# Patient Record
Sex: Male | Born: 2000 | Race: White | Hispanic: No | Marital: Single | Smoking: Never smoker
Health system: Southern US, Community
[De-identification: ages and names within clinical notes are randomized; demographics above are authoritative.]

## PROBLEM LIST (undated history)

## (undated) DIAGNOSIS — T7840XA Allergy, unspecified, initial encounter: Secondary | ICD-10-CM

## (undated) DIAGNOSIS — F329 Major depressive disorder, single episode, unspecified: Secondary | ICD-10-CM

## (undated) DIAGNOSIS — F32A Depression, unspecified: Secondary | ICD-10-CM

## (undated) DIAGNOSIS — F419 Anxiety disorder, unspecified: Secondary | ICD-10-CM

## (undated) HISTORY — DX: Anxiety disorder, unspecified: F41.9

## (undated) HISTORY — DX: Allergy, unspecified, initial encounter: T78.40XA

## (undated) HISTORY — DX: Depression, unspecified: F32.A

## (undated) HISTORY — PX: TONSILLECTOMY: SUR1361

---
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
## 2020-01-04 ENCOUNTER — Other Ambulatory Visit: Payer: Self-pay | Admitting: Family Medicine

## 2020-01-04 DIAGNOSIS — F39 Unspecified mood [affective] disorder: Secondary | ICD-10-CM

## 2020-03-29 ENCOUNTER — Other Ambulatory Visit: Payer: Self-pay | Admitting: Family Medicine

## 2020-03-29 DIAGNOSIS — F39 Unspecified mood [affective] disorder: Secondary | ICD-10-CM

## 2020-04-15 ENCOUNTER — Other Ambulatory Visit: Payer: Self-pay | Admitting: Family Medicine

## 2020-04-15 DIAGNOSIS — F39 Unspecified mood [affective] disorder: Secondary | ICD-10-CM

## 2020-09-28 ENCOUNTER — Other Ambulatory Visit: Payer: Self-pay | Admitting: Family Medicine

## 2020-09-28 DIAGNOSIS — F39 Unspecified mood [affective] disorder: Secondary | ICD-10-CM

## 2020-09-28 NOTE — Telephone Encounter (Signed)
Pt overdue office visit and refill. Pt >3 months overdue. Last RF 03/29/20 #90  [name] FNP is still showing as PCP and she is no longer with practice.   MyChart message sent to pt to call office for appointment.

## 2020-09-29 NOTE — Telephone Encounter (Signed)
lvm for pt to call the office and schedule an appt with Danelle Berry or Gabriel Cirri

## 2020-10-06 ENCOUNTER — Ambulatory Visit: Payer: BC Managed Care – PPO | Admitting: Family Medicine

## 2020-10-09 ENCOUNTER — Ambulatory Visit: Payer: BC Managed Care – PPO | Admitting: Unknown Physician Specialty

## 2020-10-09 ENCOUNTER — Encounter: Payer: Self-pay | Admitting: Unknown Physician Specialty

## 2020-10-09 ENCOUNTER — Other Ambulatory Visit: Payer: Self-pay

## 2020-10-09 VITALS — BP 120/76 | HR 82 | Temp 98.0°F | Resp 18 | Ht 74.0 in | Wt 159.7 lb

## 2020-10-09 DIAGNOSIS — F39 Unspecified mood [affective] disorder: Secondary | ICD-10-CM | POA: Diagnosis not present

## 2020-10-09 DIAGNOSIS — E785 Hyperlipidemia, unspecified: Secondary | ICD-10-CM | POA: Diagnosis not present

## 2020-10-09 DIAGNOSIS — F321 Major depressive disorder, single episode, moderate: Secondary | ICD-10-CM

## 2020-10-09 DIAGNOSIS — G4700 Insomnia, unspecified: Secondary | ICD-10-CM | POA: Diagnosis not present

## 2020-10-09 LAB — LIPID PANEL
Cholesterol: 168 mg/dL (ref <170)
HDL: 48 mg/dL (ref >45)
LDL Cholesterol (Calc): 102 mg/dL (calc) (ref <110)
Non-HDL Cholesterol (Calc): 120 mg/dL (calc) — ABNORMAL HIGH (ref <120)
Total CHOL/HDL Ratio: 3.5 (calc) (ref <5.0)
Triglycerides: 85 mg/dL (ref <90)

## 2020-10-09 LAB — COMPREHENSIVE METABOLIC PANEL
AG Ratio: 2.6 (calc) — ABNORMAL HIGH (ref 1.0–2.5)
ALT: 6 U/L — ABNORMAL LOW (ref 8–46)
AST: 12 U/L (ref 12–32)
Albumin: 4.5 g/dL (ref 3.6–5.1)
Alkaline phosphatase (APISO): 111 U/L (ref 46–169)
BUN: 13 mg/dL (ref 7–20)
CO2: 27 mmol/L (ref 20–32)
Calcium: 9.6 mg/dL (ref 8.9–10.4)
Chloride: 105 mmol/L (ref 98–110)
Creat: 0.81 mg/dL (ref 0.60–1.26)
Globulin: 1.7 g/dL (calc) — ABNORMAL LOW (ref 2.1–3.5)
Glucose, Bld: 107 mg/dL — ABNORMAL HIGH (ref 65–99)
Potassium: 4.3 mmol/L (ref 3.8–5.1)
Sodium: 139 mmol/L (ref 135–146)
Total Bilirubin: 0.6 mg/dL (ref 0.2–1.1)
Total Protein: 6.2 g/dL — ABNORMAL LOW (ref 6.3–8.2)

## 2020-10-09 LAB — CBC WITH DIFFERENTIAL/PLATELET
Absolute Monocytes: 432 cells/uL (ref 200–950)
Basophils Absolute: 30 cells/uL (ref 0–200)
Basophils Relative: 0.5 %
Eosinophils Absolute: 150 cells/uL (ref 15–500)
Eosinophils Relative: 2.5 %
HCT: 40.8 % (ref 38.5–50.0)
Hemoglobin: 13.5 g/dL (ref 13.2–17.1)
Lymphs Abs: 1602 cells/uL (ref 850–3900)
MCH: 29.7 pg (ref 27.0–33.0)
MCHC: 33.1 g/dL (ref 32.0–36.0)
MCV: 89.7 fL (ref 80.0–100.0)
MPV: 13.1 fL — ABNORMAL HIGH (ref 7.5–12.5)
Monocytes Relative: 7.2 %
Neutro Abs: 3786 cells/uL (ref 1500–7800)
Neutrophils Relative %: 63.1 %
Platelets: 167 10*3/uL (ref 140–400)
RBC: 4.55 10*6/uL (ref 4.20–5.80)
RDW: 12.5 % (ref 11.0–15.0)
Total Lymphocyte: 26.7 %
WBC: 6 10*3/uL (ref 3.8–10.8)

## 2020-10-09 MED ORDER — SERTRALINE HCL 100 MG PO TABS
1.0000 | ORAL_TABLET | Freq: Every day | ORAL | 0 refills | Status: DC
Start: 2020-10-09 — End: 2021-01-09

## 2020-10-09 NOTE — Progress Notes (Signed)
BP 120/76   Pulse 82   Temp 98 F (36.7 C)   Resp 18   Ht 6\' 2"  (1.88 m)   Wt 159 lb 11.2 oz (72.4 kg)   SpO2 98%   BMI 20.50 kg/m    Subjective:    Patient ID: Gabriel Pierce, male    DOB: 22-Mar-2001, 20 y.o.   MRN: [id number]  HPI: Gabriel Pierce is a 20 y.o. male  Chief Complaint  Patient presents with  . Follow-up  . Depression  . Medication Refill   Pt feels depression is not so much a problem, mostly irritable and trouble sleeping.  Has not yet worked with counseling.  However, does sleep from 2-10A daily but reports unable to fall asleep without lying there for 2 hours no matter what time he goes to bed.   Depression screen [facility] 2/9 10/09/2020 10/03/2019 08/09/2019 05/17/2019 04/23/2019  Decreased Interest 2 2 3 1 3   Down, Depressed, Hopeless 0 2 2 1 1   PHQ - 2 Score 2 4 5 2 4   Altered sleeping 3 3 2  0 3  Tired, decreased energy 2 2 3 1 3   Change in appetite 2 3 3  0 1  Feeling bad or failure about yourself  1 1 1 1 1   Trouble concentrating 1 1 0 1 2  Moving slowly or fidgety/restless 1 0 0 1 1  Suicidal thoughts 0 0 0 0 0  PHQ-9 Score 12 14 14 6 15   Difficult doing work/chores Somewhat difficult Somewhat difficult Somewhat difficult Somewhat difficult Somewhat difficult  Some recent data might be hidden     Relevant past medical, surgical, family and social history reviewed and updated as indicated. Interim medical history since our last visit reviewed. Allergies and medications reviewed and updated.  Review of Systems  All other systems reviewed and are negative.   Per HPI unless specifically indicated above     Objective:    BP 120/76   Pulse 82   Temp 98 F (36.7 C)   Resp 18   Ht 6\' 2"  (1.88 m)   Wt 159 lb 11.2 oz (72.4 kg)   SpO2 98%   BMI 20.50 kg/m   Wt Readings from Last 3 Encounters:  10/09/20 159 lb 11.2 oz (72.4 kg) (56 %, Z= 0.16)*  10/03/19 157 lb 12.8 oz (71.6 kg) (59 %, Z= 0.22)*  05/17/19 171 lb 1.6 oz (77.6 kg) (77 %, Z= 0.74)*   *  Growth percentiles are based on CDC (Boys, 2-20 Years) data.    Physical Exam Constitutional:      General: He is not in acute distress.    Appearance: Normal appearance. He is well-developed.  HENT:     Head: Normocephalic and atraumatic.  Eyes:     General: Lids are normal. No scleral icterus.       Right eye: No discharge.        Left eye: No discharge.     Conjunctiva/sclera: Conjunctivae normal.  Cardiovascular:     Rate and Rhythm: Normal rate.  Pulmonary:     Effort: Pulmonary effort is normal.  Abdominal:     Palpations: There is no hepatomegaly or splenomegaly.  Musculoskeletal:        General: Normal range of motion.  Skin:    Coloration: Skin is not pale.     Findings: No rash.  Neurological:     Mental Status: He is alert and oriented to person, place, and time.  Psychiatric:  Behavior: Behavior normal.        Thought Content: Thought content normal.        Judgment: Judgment normal.      Assessment & Plan:   Problem List Items Addressed This Visit      Unprioritized   Current moderate episode of major depressive disorder without prior episode (HCC) - Primary    Pt reports mood good but irritable and trouble sleeping.  Continue Zoloft.  Refer to Child psychotherapist for counseling resources.        Relevant Medications   sertraline (ZOLOFT) 100 MG tablet   Other Relevant Orders   AMB Referral to [facility]ordin[facility]   Comprehensive metabolic panel   CBC with Differential/Platelet   TSH   Insomnia    Persistent problem.  Written suggestions given for the following lifestyle changes and supplements: Blue light blockers - when sun goes down.    Supplements Melatonin 1 mg or less Ltheanine 1 hour before bed Magnesium Glycinate 1 hour before bed For agitation - Ashwagandha  Podcast: [facility] - first 4 episodes which discusses chronobiology         Relevant Orders   AMB Referral to [facility]    Other Visit Diagnoses     Hyperlipidemia, unspecified hyperlipidemia type       Relevant Orders   Lipid panel   Mood disorder (HCC)       Relevant Medications   sertraline (ZOLOFT) 100 MG tablet       Follow up plan: Return in about 3 months (around 01/09/2021).

## 2020-10-09 NOTE — Patient Instructions (Signed)
Blue light blockers - when sun goes down.    Supplements Melatonin 1 mg or less Ltheanine 1 hour before bed Magnesium Glycinate 1 hour before bed For agitation - Ashwagandha  Podcast: [facility] - first 4 episodes.

## 2020-10-09 NOTE — Assessment & Plan Note (Signed)
Persistent problem.  Written suggestions given for the following lifestyle changes and supplements: Blue light blockers - when sun goes down.    Supplements Melatonin 1 mg or less Ltheanine 1 hour before bed Magnesium Glycinate 1 hour before bed For agitation - Ashwagandha  Podcast: [facility] - first 4 episodes which discusses chronobiology

## 2020-10-09 NOTE — Assessment & Plan Note (Signed)
Pt reports mood good but irritable and trouble sleeping.  Continue Zoloft.  Refer to Child psychotherapist for counseling resources.

## 2020-10-16 ENCOUNTER — Other Ambulatory Visit: Payer: Self-pay | Admitting: Unknown Physician Specialty

## 2020-10-16 DIAGNOSIS — F321 Major depressive disorder, single episode, moderate: Secondary | ICD-10-CM

## 2020-10-17 ENCOUNTER — Telehealth: Payer: Self-pay | Admitting: *Deleted

## 2020-10-17 NOTE — Chronic Care Management (AMB) (Signed)
  Care Management   Outreach Note  10/17/2020 Name: Gabriel Pierce MRN: [id number] DOB: [birth year]  Referred by: Doren Custard, FNP Reason for referral : Care Coordination (Initial outreach to schedule referral with Licensed Clinical SW )   An unsuccessful telephone outreach was attempted today. The patient was referred to the case management team for assistance with care management and care coordination.   Follow Up Plan: A HIPAA compliant phone message was left for the patient providing contact information and requesting a return call. The care management team will reach out to the patient again over the next 7 days. If patient returns call to provider office, please advise to call Embedded Care Management Care Guide Gwenevere Ghazi at [phone].  Gwenevere Ghazi  Care Guide, Embedded Care Coordination [facility] Management

## 2020-10-24 NOTE — Chronic Care Management (AMB) (Signed)
  Care Management   Outreach Note  10/24/2020 Name: Gabriel Pierce MRN: [id number] DOB: Feb 18, 2001  Referred by: Doren Custard, FNP Reason for referral : Care Coordination (Initial outreach to schedule referral with Licensed Clinical SW )   A second unsuccessful telephone outreach was attempted today. The patient was referred to the case management team for assistance with care management and care coordination.   Follow Up Plan: A HIPAA compliant phone message was left for the patient providing contact information and requesting a return call. The care management team will reach out to the patient again over the next 7 days. If patient returns call to provider office, please advise to call Embedded Care Management Care Guide Gwenevere Ghazi at [phone].  Gwenevere Ghazi  Care Guide, Embedded Care Coordination [facility] Management

## 2020-10-30 NOTE — Chronic Care Management (AMB) (Signed)
  Care Management   Note  10/30/2020 Name: Alvester Eads MRN: [id number] DOB: [birth year]  Jarrius Huaracha is a 20 y.o. year old male who is a primary care patient of Doren Custard, FNP. I reached out to [name] by phone today in response to a referral sent by Mr. Preet Haze's health plan.    Mr. Klemann was given information about care management services today including:  Care management services include personalized support from designated clinical staff supervised by his physician, including individualized plan of care and coordination with other care providers 24/7 contact phone numbers for assistance for urgent and routine care needs. The patient may stop care management services at any time by phone call to the office staff.  Patient agreed to services and verbal consent obtained.   Follow up plan: Telephone appointment with care management team member scheduled for: 11/03/2020 with Licensed Clinical SW   Burman Nieves, CCMA Care Guide, Embedded Care Coordination [facility]  Care Management  Direct Dial: [phone]

## 2020-11-03 ENCOUNTER — Ambulatory Visit: Payer: BC Managed Care – PPO | Admitting: *Deleted

## 2020-11-03 DIAGNOSIS — F321 Major depressive disorder, single episode, moderate: Secondary | ICD-10-CM

## 2020-11-03 DIAGNOSIS — F32A Depression, unspecified: Secondary | ICD-10-CM

## 2020-11-03 NOTE — Patient Instructions (Addendum)
Visit Information   Goals Addressed             This Visit's Progress    Manage My Emotions       Timeframe:  Long-Range Goal Priority:  Medium Start Date:     11/03/20                        Expected End Date:      05/05/21                 Follow Up Date 11/14/20    - begin personal counseling - call and visit an old friend - practice relaxation or meditation daily - talk about feelings with a friend, family or spiritual advisor - practice positive thinking and self-talk    Why is this important?   When you are stressed, down or upset, your body reacts too.  For example, your blood pressure may get higher; you may have a headache or stomachache.  When your emotions get the best of you, your body's ability to fight off cold and flu gets weak.  These steps will help you manage your emotions.     Notes:         [phone]

## 2020-11-03 NOTE — Chronic Care Management (AMB) (Signed)
Care Management Clinical Social Work Note  11/03/2020 Name: Gabriel Pierce MRN: [id number] DOB: [birth year]  Gabriel Pierce is a 20 y.o. year old male who is a primary care patient of Doren Custard, FNP.  The Care Management team was consulted for assistance with chronic disease management and coordination needs.  Engaged with patient by telephone for initial visit in response to provider referral for social work chronic care management and care coordination services  Consent to Services:  Gabriel Pierce was given information about Care Management services today including:  Care Management services includes personalized support from designated clinical staff supervised by his physician, including individualized plan of care and coordination with other care providers 24/7 contact phone numbers for assistance for urgent and routine care needs. The patient may stop case management services at any time by phone call to the office staff.  Patient agreed to services and consent obtained.   Assessment: Review of patient past medical history, allergies, medications, and health status, including review of relevant consultants reports was performed today as part of a comprehensive evaluation and provision of chronic care management and care coordination services.  SDOH (Social Determinants of Health) assessments and interventions performed:    Advanced Directives Status: Not addressed in this encounter.  Care Plan  No Known Allergies  Outpatient Encounter Medications as of 11/03/2020  Medication Sig   sertraline (ZOLOFT) 100 MG tablet Take 1 tablet (100 mg total) by mouth daily.   No facility-administered encounter medications on file as of 11/03/2020.    Patient Active Problem List   Diagnosis Date Noted   Insomnia 10/09/2020   Current moderate episode of major depressive disorder without prior episode (HCC) 12/12/2018    Conditions to be addressed/monitored: Depression; Mental Health Concerns    Care Plan : Anxiety (Adult)  Updates made by Gabriel Overland, Gabriel Pierce since 11/03/2020 12:00 AM     Problem: Symptoms (Anxiety)      Care Plan : Depression (Adult)  Updates made by Gabriel Overland, Gabriel Pierce since 11/03/2020 12:00 AM     Problem: Symptoms (Depression)      Goal: Symptoms Monitored and Managed   Start Date: 11/03/2020  Expected End Date: 05/05/2021  This Visit's Progress: On track  Priority: Medium  Note:   Current Barriers:  Chronic Mental Health needs related to Anxiety and Depression Mental Health Concerns  Suicidal Ideation/Homicidal Ideation: No  Clinical Social Work Goal(s):  Over the next 90 days patient will follow up with a mental health therapist for ongoing mental health support-[facility] referral to be completed  Interventions: Patient confirmed history of depression and anxiety and is currently taking medication to address symptoms Patient discussed history of mental health treatment, however no follow up currently-did not find it beneficial Patient describes a positive support system, the recent start of meditation practices and cutting down on smoking as current tools to manage symptoms Benefits of mental health therapy discussed-assistance with referral to available mental health therapist for ongoing treatment promoted with plan to development and reinforce self management skills Contact number for the mental health crisis line provided to patient if needed in the future   Patient Self Care Activities:  Performs ADL's independently Performs IADL's independently Strong family or social support  Patient Coping Strengths:  Supportive Relationships Family Friends Able to Communicate Effectively  Patient Self Care Deficits:  Lacks community resources for mental health follow up  Initial goal documentation     Task: Alleviate Barriers to Depression Treatment  Follow Up Plan: SW will follow up with patient by phone over the next 7-14  business days  Gabriel Pierce, [place] Clinical Social Worker  [facility] Care Management [phone]

## 2020-11-26 ENCOUNTER — Telehealth: Payer: Self-pay | Admitting: *Deleted

## 2020-11-26 NOTE — Telephone Encounter (Signed)
  Care Management   Follow Up Note   11/26/2020 Name: Gabriel Pierce MRN: [id number] DOB: Apr 10, 2001   Referred by: Doren Custard, FNP Reason for referral : Care Coordination   An unsuccessful telephone outreach was attempted today. The patient was referred to the case management team for assistance with care management and care coordination.   Follow Up Plan: Telephone follow up appointment with care management team member scheduled for:11/28/20  Verna Czech, LCSW Clinical Social Worker  [facility] Care Management [phone]

## 2020-11-28 ENCOUNTER — Telehealth: Payer: Self-pay | Admitting: *Deleted

## 2020-11-28 NOTE — Telephone Encounter (Signed)
  Care Management   Follow Up Note   11/28/2020 Name: Gabriel Pierce MRN: [id number] DOB: 2000-12-16   Referred by: Doren Custard, FNP Reason for referral : Care Coordination   A second unsuccessful telephone outreach was attempted today. The patient was referred to the case management team for assistance with care management and care coordination.   Follow Up Plan: Telephone follow up appointment with care management team member scheduled for:12/05/20  Verna Czech, LCSW Clinical Social Worker  [facility] Care Management [phone]

## 2020-12-05 ENCOUNTER — Ambulatory Visit: Payer: Self-pay | Admitting: *Deleted

## 2020-12-05 DIAGNOSIS — F321 Major depressive disorder, single episode, moderate: Secondary | ICD-10-CM

## 2020-12-05 DIAGNOSIS — F419 Anxiety disorder, unspecified: Secondary | ICD-10-CM

## 2020-12-05 NOTE — Patient Instructions (Signed)
Visit Information   Goals Addressed             This Visit's Progress    Manage My Emotions       Timeframe:  Long-Range Goal Priority:  Medium Start Date:     11/03/20                        Expected End Date:      05/05/21                 Follow Up Date 12/26/20    - begin personal counseling - continue to modify/manage schedule to minimize stress - practice relaxation or meditation daily - talk about feelings with a friend, family or spiritual advisor - practice positive thinking and self-talk    Why is this important?   When you are stressed, down or upset, your body reacts too.  For example, your blood pressure may get higher; you may have a headache or stomachache.  When your emotions get the best of you, your body's ability to fight off cold and flu gets weak.  These steps will help you manage your emotions.     Notes:         The patient verbalized understanding of instructions, educational materials, and care plan provided today and declined offer to receive copy of patient instructions, educational materials, and care plan.   Telephone follow up appointment with care management team member scheduled for:12/26/20  Verna Czech, LCSW Clinical Social Worker  [facility] Care Management [phone]

## 2020-12-05 NOTE — Chronic Care Management (AMB) (Signed)
Care Management Clinical Social Work Note  12/05/2020 Name: Gabriel Pierce MRN: [id number] DOB: [birth year]  Gabriel Pierce is a 20 y.o. year old male who is a primary care patient of Doren Custard, FNP.  The Care Management team was consulted for assistance with chronic disease management and coordination needs.  Engaged with patient by telephone for follow up visit in response to provider referral for social work chronic care management and care coordination services.  Assessment: Review of patient past medical history, allergies, medications, and health status, including review of relevant consultants reports was performed today as part of a comprehensive evaluation and provision of chronic care management and care coordination services.  SDOH (Social Determinants of Health) assessments and interventions performed:    Advanced Directives Status: Not addressed in this encounter.  Care Plan  No Known Allergies  Outpatient Encounter Medications as of 12/05/2020  Medication Sig   sertraline (ZOLOFT) 100 MG tablet Take 1 tablet (100 mg total) by mouth daily.   No facility-administered encounter medications on file as of 12/05/2020.    Patient Active Problem List   Diagnosis Date Noted   Insomnia 10/09/2020   Current moderate episode of major depressive disorder without prior episode (HCC) 12/12/2018    Conditions to be addressed/monitored: Depression; Mental Health Concerns   Care Plan : Depression (Adult)  Updates made by Wenda Overland, LCSW since 12/05/2020 12:00 AM     Problem: Symptoms (Depression)      Goal: Symptoms Monitored and Managed   Start Date: 11/03/2020  Expected End Date: 05/05/2021  This Visit's Progress: On track  Recent Progress: On track  Priority: Medium  Note:   Current Barriers:  Chronic Mental Health needs related to Anxiety and Depression Mental Health Concerns  Suicidal Ideation/Homicidal Ideation: No  Clinical Social Work Goal(s):  Over the next  90 days patient will follow up with a mental health therapist for ongoing mental health support-[facility] referral to be completed  Interventions: Follow up phone call to patient to check management of patient's depression and anxiety  Patient confirmed that symptoms have started to improve with establishing himself with a schedule, reaching out to family and friends for support and establishing future goals to attain  and look forward to (buying a new car) Patient admits to missing calls from therapist arranged through [facility], however does not feel it is necessary at this time Maintaining a modified schedule to decrease stress encouraged along with participation in mental health treatment.   Patient Self Care Activities:  Performs ADL's independently Performs IADL's independently Strong family or social support  Patient Coping Strengths:  Supportive Relationships Family Friends Able to Communicate Effectively  Patient Self Care Deficits:  Lacks community resources for mental health follow up         Follow Up Plan: SW will follow up with patient by phone over the next 30 business days  [employer], LCSW Clinical Social Worker  [facility] Care Management [phone]

## 2020-12-26 ENCOUNTER — Ambulatory Visit: Payer: BC Managed Care – PPO | Admitting: *Deleted

## 2020-12-26 DIAGNOSIS — F321 Major depressive disorder, single episode, moderate: Secondary | ICD-10-CM

## 2020-12-26 DIAGNOSIS — R110 Nausea: Secondary | ICD-10-CM

## 2020-12-26 NOTE — Patient Instructions (Signed)
Visit Information   Goals Addressed             This Visit's Progress    Manage My Emotions       Timeframe:  Long-Range Goal Priority:  Medium Start Date:     11/03/20                        Expected End Date:      05/05/21                 Follow Up Date 12/26/20    - begin personal counseling if desired - continue to modify/manage schedule to minimize stress - practice relaxation or meditation daily - talk about feelings with a friend, family or spiritual advisor - practice positive thinking and self-talk    Why is this important?   When you are stressed, down or upset, your body reacts too.  For example, your blood pressure may get higher; you may have a headache or stomachache.  When your emotions get the best of you, your body's ability to fight off cold and flu gets weak.  These steps will help you manage your emotions.     Notes:         The patient verbalized understanding of instructions, educational materials, and care plan provided today and declined offer to receive copy of patient instructions, educational materials, and care plan.   No further follow up required: patient to call this Child psychotherapist with any additional community resource needs  [employer], LCSW Clinical Social Worker  [facility] Care Management [phone]

## 2020-12-26 NOTE — Chronic Care Management (AMB) (Signed)
Care Management Clinical Social Work Note  12/26/2020 Name: Gabriel Pierce MRN: [id number] DOB: 12-28-2000  Gabriel Pierce is a 20 y.o. year old male who is a primary care patient of Doren Custard, FNP.  The Care Management team was consulted for assistance with chronic disease management and coordination needs.  Engaged with patient by telephone for follow up visit in response to provider referral for social work chronic care management and care coordination services  Assessment: Review of patient past medical history, allergies, medications, and health status, including review of relevant consultants reports was performed today as part of a comprehensive evaluation and provision of chronic care management and care coordination services.  SDOH (Social Determinants of Health) assessments and interventions performed:    Advanced Directives Status: Not addressed in this encounter.  Care Plan  No Known Allergies  Outpatient Encounter Medications as of 12/26/2020  Medication Sig   sertraline (ZOLOFT) 100 MG tablet Take 1 tablet (100 mg total) by mouth daily.   No facility-administered encounter medications on file as of 12/26/2020.    Patient Active Problem List   Diagnosis Date Noted   Insomnia 10/09/2020   Current moderate episode of major depressive disorder without prior episode (HCC) 12/12/2018    Conditions to be addressed/monitored: Depression; Mental Health Concerns   Care Plan : Depression (Adult)  Updates made by Wenda Overland, LCSW since 12/26/2020 12:00 AM     Problem: Symptoms (Depression)      Goal: Symptoms Monitored and Managed   Start Date: 11/03/2020  Expected End Date: 05/05/2021  This Visit's Progress: On track  Recent Progress: On track  Priority: Medium  Note:   Current Barriers:  Chronic Mental Health needs related to Anxiety and Depression Mental Health Concerns  Suicidal Ideation/Homicidal Ideation: No  Clinical Social Work Goal(s):  Over the next  90 days patient will follow up with a mental health therapist for ongoing mental health support-[facility] referral to be completed  Interventions: Follow up phone call to patient to check management of patient's depression and anxiety  Patient confirmed that symptoms have started to improve with continued practice of establishing and maintaining a schedule, reaching out to family and friends for support and looking forward to returning to school. Patient continues to declined need for referral for ongoing mental health counseling. Patient confirmed plan to slowly reintegrate himself back in school to avoid overwhelm   Verbalization of feeling encouraged, emotional support and encouragement provided Patient confirmed no need for continued case management follow up Maintaining a modified schedule to decrease stress encouraged along with participation in mental health treatment in the future if needed Patient encouraged to call this social worker with any additional community resource needs  Patient Self Care Activities:  Performs ADL's independently Performs IADL's independently Strong family or social support  Patient Coping Strengths:  Supportive Relationships Family Friends Able to Communicate Effectively  Patient Self Care Deficits:  Lacks community resources for mental health follow up         Follow Up Plan: Client will contact this Child psychotherapist with any additional community resource needs  [place], LCSW Clinical Social Worker  [facility] Care Management [phone]

## 2021-01-09 ENCOUNTER — Ambulatory Visit: Payer: BC Managed Care – PPO | Admitting: Family Medicine

## 2021-01-09 ENCOUNTER — Other Ambulatory Visit: Payer: Self-pay

## 2021-01-09 ENCOUNTER — Encounter: Payer: Self-pay | Admitting: Family Medicine

## 2021-01-09 DIAGNOSIS — F39 Unspecified mood [affective] disorder: Secondary | ICD-10-CM

## 2021-01-09 MED ORDER — SERTRALINE HCL 100 MG PO TABS: 100.0000 mg | ORAL_TABLET | Freq: Every day | ORAL | 3 refills | Status: DC

## 2021-01-09 NOTE — Progress Notes (Signed)
Name: Oreoluwa Aigner   MRN: [id number]    DOB: 03/04/2001   Date:01/09/2021       Progress Note  Chief Complaint  Patient presents with   Depression     Subjective:   Gabriel Pierce is a 20 y.o. male, presents to clinic for routine f/up  Here for med refill and f/up  Depression screen [facility] 2/9 01/09/2021 10/09/2020 10/03/2019  Decreased Interest 2 2 2   Down, Depressed, Hopeless 1 0 2  PHQ - 2 Score 3 2 4   Altered sleeping 1 3 3   Tired, decreased energy 1 2 2   Change in appetite 1 2 3   Feeling bad or failure about yourself  1 1 1   Trouble concentrating 1 1 1   Moving slowly or fidgety/restless 1 1 0  Suicidal thoughts 1 0 0  PHQ-9 Score 10 12 14   Difficult doing work/chores Somewhat difficult Somewhat difficult Somewhat difficult  Some recent data might be hidden   GAD 7 : Generalized Anxiety Score 01/09/2021 10/09/2020 09/25/2018  Nervous, Anxious, on Edge 1 2 2   Control/stop worrying 1 1 2   Worry too much - different things 1 1 1   Trouble relaxing 2 3 3   Restless 2 2 2   Easily annoyed or irritable 1 3 2   Afraid - awful might happen 1 2 1   Total GAD 7 Score 9 14 13   Anxiety Difficulty Somewhat difficult - Extremely difficult  Good friend group, thoughts of death, but not SI Mother with anxiety, Gabriel Pierce is doing well in school No concerns about meds or SE, would like to stay on same dose       Current Outpatient Medications:    sertraline (ZOLOFT) 100 MG tablet, Take 1 tablet (100 mg total) by mouth daily., Disp: 90 tablet, Rfl: 0  Patient Active Problem List   Diagnosis Date Noted   Insomnia 10/09/2020   Current moderate episode of major depressive disorder without prior episode (HCC) 12/12/2018    Past Surgical History:  Procedure Laterality Date   TONSILLECTOMY      Family History  Problem Relation Age of Onset   Anxiety disorder Mother    Depression Mother    Hypertension Father     Social History   Tobacco Use   Smoking status: Never   Smokeless tobacco:  Never  Vaping Use   Vaping Use: Never used  Substance Use Topics   Alcohol use: No   Drug use: Yes    Types: Marijuana     No Known Allergies  Health Maintenance  Topic Date Due   INFLUENZA VACCINE  12/15/2020   TETANUS/TDAP  01/05/2022   HPV VACCINES  Completed   COVID-19 Vaccine  Completed   Hepatitis C Screening  Completed   HIV Screening  Completed   Pneumococcal Vaccine 41-23 Years old  Aged Out    Chart Review Today: I personally reviewed active problem list, medication list, allergies, family history, social history, health maintenance, notes from last encounter, lab results, imaging with the patient/caregiver today.   Review of Systems  Constitutional: Negative.   HENT: Negative.    Eyes: Negative.   Respiratory: Negative.    Cardiovascular: Negative.   Gastrointestinal: Negative.   Endocrine: Negative.   Genitourinary: Negative.   Musculoskeletal: Negative.   Skin: Negative.   Allergic/Immunologic: Negative.   Neurological: Negative.   Hematological: Negative.   Psychiatric/Behavioral: Negative.    All other systems reviewed and are negative.     Objective:   Vitals:   01/09/21 1337  BP: 110/74  Pulse: 92  Resp: 16  Temp: 98.6 F (37 C)  SpO2: 98%  Weight: 159 lb 12.8 oz (72.5 kg)    Body mass index is 20.52 kg/m.  Physical Exam Vitals and nursing note reviewed.  Constitutional:      General: Gabriel Pierce is not in acute distress.    Appearance: Normal appearance. Gabriel Pierce is well-developed and normal weight. Gabriel Pierce is not ill-appearing, toxic-appearing or diaphoretic.  HENT:     Head: Normocephalic and atraumatic.     Nose: Nose normal.  Eyes:     General:        Right eye: No discharge.        Left eye: No discharge.     Conjunctiva/sclera: Conjunctivae normal.  Neck:     Trachea: No tracheal deviation.  Cardiovascular:     Rate and Rhythm: Normal rate and regular rhythm.  Pulmonary:     Effort: Pulmonary effort is normal. No respiratory distress.      Breath sounds: No stridor.  Musculoskeletal:        General: Normal range of motion.  Skin:    General: Skin is warm and dry.     Findings: No rash.  Neurological:     Mental Status: Gabriel Pierce is alert.     Motor: No abnormal muscle tone.     Coordination: Coordination normal.  Psychiatric:        Attention and Perception: Attention and perception normal.        Mood and Affect: Mood and affect normal.        Speech: Speech normal.        Behavior: Behavior normal. Behavior is cooperative.        Thought Content: Thought content normal. Thought content is not paranoid or delusional. Thought content does not include homicidal ideation. Thought content does not include homicidal or suicidal plan.        Assessment & Plan:     ICD-10-CM   1. Mood disorder (HCC)  F39 sertraline (ZOLOFT) 100 MG tablet   sx stable, phq reviewed, no SE or concerns, Gabriel Pierce would like to continue same med, refills entered        Return in about 1 year (around 01/09/2022) for Routine follow-up/CPE.   Danelle Berry, PA-C 01/09/21 2:54 PM

## 2021-12-21 ENCOUNTER — Ambulatory Visit: Payer: BC Managed Care – PPO | Admitting: Family Medicine

## 2021-12-25 ENCOUNTER — Ambulatory Visit: Payer: BC Managed Care – PPO | Admitting: Family Medicine

## 2021-12-25 ENCOUNTER — Encounter: Payer: Self-pay | Admitting: Family Medicine

## 2021-12-25 VITALS — BP 110/64 | HR 78 | Temp 98.5°F | Resp 16 | Ht 74.5 in | Wt 154.8 lb

## 2021-12-25 DIAGNOSIS — G4700 Insomnia, unspecified: Secondary | ICD-10-CM

## 2021-12-25 DIAGNOSIS — F39 Unspecified mood [affective] disorder: Secondary | ICD-10-CM | POA: Insufficient documentation

## 2021-12-25 DIAGNOSIS — F321 Major depressive disorder, single episode, moderate: Secondary | ICD-10-CM | POA: Diagnosis not present

## 2021-12-25 DIAGNOSIS — Z0000 Encounter for general adult medical examination without abnormal findings: Secondary | ICD-10-CM

## 2021-12-25 MED ORDER — SERTRALINE HCL 100 MG PO TABS: 150.0000 mg | ORAL_TABLET | Freq: Every day | ORAL | 1 refills | Status: DC

## 2021-12-25 NOTE — Progress Notes (Signed)
Name: Gabriel Pierce   MRN: [id number]    DOB: Jan 29, 2001   Date:12/25/2021       Progress Note  Chief Complaint  Patient presents with   Annual Exam     Subjective:   Gabriel Pierce is a 21 y.o. male, presents to clinic for CPE - but he and his mother would instead prefer to address moods/meds.     12/25/2021    2:29 PM 01/09/2021    1:40 PM 10/09/2020   10:41 AM  Depression screen PHQ 2/9  Decreased Interest 3 2 2   Down, Depressed, Hopeless 3 1 0  PHQ - 2 Score 6 3 2   Altered sleeping 2 1 3   Tired, decreased energy 2 1 2   Change in appetite 1 1 2   Feeling bad or failure about yourself  1 1 1   Trouble concentrating 1 1 1   Moving slowly or fidgety/restless 1 1 1   Suicidal thoughts 0 1 0  PHQ-9 Score 14 10 12   Difficult doing work/chores Somewhat difficult Somewhat difficult Somewhat difficult      01/09/2021    1:41 PM 10/09/2020   10:42 AM 09/25/2018   10:54 AM  GAD 7 : Generalized Anxiety Score  Nervous, Anxious, on Edge 1 2 2   Control/stop worrying 1 1 2   Worry too much - different things 1 1 1   Trouble relaxing 2 3 3   Restless 2 2 2   Easily annoyed or irritable 1 3 2   Afraid - awful might happen 1 2 1   Total GAD 7 Score 9 14 13   Anxiety Difficulty Somewhat difficult  Extremely difficult   In the past he was referred to psychiatry but he was hesitant to go, [facility] there does not seem like he ever continued with the clinic for further evaluation or therapy We will request records they are not in chart In the past he has tried Lexapro, Zoloft, Wellbutrin, BuSpar, hydroxyzine, for sleep he is additionally tried Seroquel Last year there was a prescription for Abilify but he states it did not help him. He has been mostly at home not working or exercising, staying in his room most of the summer, smoking marijuana every evening to be able to get to sleep and on evenings when he does not he cannot get to sleep and  he begins to feel agitated irritable and angry.  He has been trying positive self talk but he is upset with himself and how his emotions feel He does anticipate exercising more and having better sleep when he returns to college in the next week or 2    Current Outpatient Medications:    sertraline (ZOLOFT) 100 MG tablet, Take 1.5 tablets (150 mg total) by mouth daily., Disp: 135 tablet, Rfl: 1  Patient Active Problem List   Diagnosis Date Noted   Insomnia 10/09/2020   Current moderate episode of major depressive disorder without prior episode (HCC) 12/12/2018    Past Surgical History:  Procedure Laterality Date   TONSILLECTOMY      Family History  Problem Relation Age of Onset   Anxiety disorder Mother    Depression Mother    Hypertension Father     Social History   Tobacco Use   Smoking status: Never   Smokeless tobacco: Never  Vaping Use   Vaping Use: Never used  Substance Use Topics   Alcohol use: No   Drug use: Yes    Types: Marijuana  No Known Allergies  Health Maintenance  Topic Date Due   COVID-19 Vaccine (4 - Booster for Janssen series) 03/12/2020   INFLUENZA VACCINE  12/15/2021   TETANUS/TDAP  01/05/2022   HPV VACCINES  Completed   Hepatitis C Screening  Completed   HIV Screening  Completed    Chart Review Today: I personally reviewed active problem list, medication list, allergies, family history, social history, health maintenance, notes from last encounter, lab results, imaging with the patient/caregiver today.   Review of Systems  Constitutional: Negative.   HENT: Negative.    Eyes: Negative.   Respiratory: Negative.    Cardiovascular: Negative.   Gastrointestinal: Negative.   Endocrine: Negative.   Genitourinary: Negative.   Musculoskeletal: Negative.   Skin: Negative.   Allergic/Immunologic: Negative.   Neurological: Negative.   Hematological: Negative.   Psychiatric/Behavioral: Negative.    All other systems reviewed and are  negative.    Objective:   Vitals:   12/25/21 1432  BP: 110/64  Pulse: 78  Resp: 16  Temp: 98.5 F (36.9 C)  TempSrc: Oral  SpO2: 97%  Weight: 154 lb 12.8 oz (70.2 kg)  Height: 6' 2.5" (1.892 m)    Body mass index is 19.61 kg/m.  Physical Exam Vitals and nursing note reviewed.  Constitutional:      General: He is not in acute distress.    Appearance: Normal appearance. He is well-developed, well-groomed and normal weight. He is not ill-appearing, toxic-appearing or diaphoretic.  HENT:     Head: Normocephalic and atraumatic.     Jaw: No trismus.     Right Ear: External ear normal.     Left Ear: External ear normal.  Eyes:     General: Lids are normal. No scleral icterus.       Right eye: No discharge.        Left eye: No discharge.     Conjunctiva/sclera: Conjunctivae normal.  Neck:     Trachea: Trachea and phonation normal. No tracheal deviation.  Cardiovascular:     Rate and Rhythm: Normal rate and regular rhythm.     Pulses: Normal pulses.          Radial pulses are 2+ on the right side and 2+ on the left side.       Posterior tibial pulses are 2+ on the right side and 2+ on the left side.     Heart sounds: Normal heart sounds. No murmur heard.    No friction rub. No gallop.  Pulmonary:     Effort: Pulmonary effort is normal. No respiratory distress.     Breath sounds: Normal breath sounds. No stridor. No wheezing, rhonchi or rales.  Abdominal:     General: Bowel sounds are normal.     Palpations: Abdomen is soft.  Musculoskeletal:     Right lower leg: No edema.     Left lower leg: No edema.  Skin:    General: Skin is warm and dry.     Coloration: Skin is not jaundiced.     Findings: No rash.     Nails: There is no clubbing.  Neurological:     Mental Status: He is alert. Mental status is at baseline.     Cranial Nerves: No dysarthria or facial asymmetry.     Motor: No tremor or abnormal muscle tone.     Gait: Gait normal.  Psychiatric:         Attention and Perception: Attention and perception normal.  Mood and Affect: Mood and affect normal.        Speech: Speech normal.        Behavior: Behavior normal. Behavior is cooperative.        Thought Content: Thought content normal. Thought content does not include homicidal or suicidal ideation.        Cognition and Memory: Cognition and memory normal.         Assessment & Plan:     ICD-10-CM   1. Current moderate episode of major depressive disorder, unspecified whether recurrent (HCC)  F32.1 sertraline (ZOLOFT) 100 MG tablet   phq is positive and slightly higher than last visit, no suicidal ideations, increase Zoloft from 100 mg to 150 and close follow-up   Today we have discussed various options -including establishing again with a psychiatry clinic for further and in-depth evaluation of his moods and hopefully establishing with a therapist  I do think he should continue with the increased Zoloft dose, take 150 mg Zoloft daily in the morning  I feel that returning to school will likely help his moods because of the structure and increased productivity and socialization he also states he will return to the gym more  Can monitor for the next 1 to 2 months when returning to college at [employer]  We discussed resources if he has any worsening moods or develops any suicidal thoughts or plan    2. Insomnia, unspecified type  G47.00    He can only sleep when smoking marijuana otherwise has difficulty sleeping  The patient and his mother both feel that marijuana has been effectively minimizing his irritable moods but he would like to not be dependent on marijuana for his moods and for good sleep he would like to stop smoking regularly and do it only occasionally or for recreational use.  We discussed sleep hygiene -advised to increase sleep hygiene efforts and for now monitor his insomnia  If he returns to college and begins to try to decrease marijuana use and has  worsening symptoms I have encouraged him to let me know so I can send in something for sleep and agitation, but I did explain that most sleep medications are preferably only used temporarily or as needed and I would not like to give him anything that he would become dependent on or which would cause rebound insomnia      3. Mood disorder (HCC)  F39    He is concerned with irritability and agitated mood which gets worse when he does not smoke marijuana   Overall it seems that he has not had a thorough assessment from the specialsits and is probably not on the optimal medications for him.  He states that Zoloft does help a little bit but he continues to have mood swings on Zoloft when not smoking marijuana  We discussed options of adding Abilify again and monitoring, trying a different medication like Vraylar, or going to the specialist for their in-depth evaluation and recommendations which I think is the best option.  It does not seem that he has any mania, he currently denies SI.     Requested records from [facility] f/up virtual ok in 1-2 months He will call if anything worsens or if he needs meds for sleep/agitation    Danelle Berry, PA-C 12/25/21 3:20 PM

## 2021-12-25 NOTE — Progress Notes (Deleted)
Patient: Gabriel Pierce, Male    DOB: December 16, 2000, 21 y.o.   MRN: [id number] Delsa Grana, PA-C Visit Date: 12/25/2021  Today's Provider: Delsa Grana, PA-C   Chief Complaint  Patient presents with   Annual Exam   Subjective:   Annual physical exam:  Gabriel Pierce is a 21 y.o. male who presents today for health maintenance and annual & complete physical exam.   Exercise/Activity:  not much exercise this summer, going back to gym more when going back to school Diet/nutrition:  not eating as much when at home Sleep: not sleeping well    SDOH Screenings   Alcohol Screen: Low Risk  (12/25/2021)   Alcohol Screen    Last Alcohol Screening Score (AUDIT): 0  Depression (PHQ2-9): High Risk (12/25/2021)   Depression (PHQ2-9)    PHQ-2 Score: 14  Financial Resource Strain: Low Risk  (12/25/2021)   Overall Financial Resource Strain (CARDIA)    Difficulty of Paying Living Expenses: Not hard at all  Food Insecurity: No Food Insecurity (12/25/2021)   Hunger Vital Sign    Worried About Running Out of Food in the Last Year: Never true    [place] in the Last Year: Never true  Housing: Low Risk  (12/25/2021)   Housing    Last Housing Risk Score: 0  Physical Activity: Insufficiently Active (12/25/2021)   Exercise Vital Sign    Days of Exercise per Week: 1 day    Minutes of Exercise per Session: 30 min  Social Connections: Socially Isolated (12/25/2021)   Social Connection and Isolation Panel [NHANES]    Frequency of Communication with Friends and Family: More than three times a week    Frequency of Social Gatherings with Friends and Family: More than three times a week    Attends Religious Services: Never    Marine scientist or Organizations: No    Attends Archivist Meetings: Never    Marital Status: Never married  Stress: Stress Concern Present (12/25/2021)   [employer] [place]    Feeling of Stress : Rather much   Tobacco Use: Low Risk  (12/25/2021)   Patient History    Smoking Tobacco Use: Never    Smokeless Tobacco Use: Never    Passive Exposure: Not on file  Transportation Needs: No Transportation Needs (12/25/2021)   PRAPARE - Transportation    Lack of Transportation (Medical): No    Lack of Transportation (Non-Medical): No    Pt wished  *** discuss acute complaints  *** do routine f/up on chronic conditions today in addition to CPE. Advised pt of separate visit billing/coding  USPSTF grade A and B recommendations - reviewed and addressed today  Depression:  Phq 9 completed today by patient, was reviewed by me with patient in the room, score is  {Desc; negative/positive:13464}, pt feels ***    12/25/2021    2:29 PM 01/09/2021    1:40 PM 10/09/2020   10:41 AM  Depression screen PHQ 2/9  Decreased Interest '3 2 2  ' Down, Depressed, Hopeless 3 1 0  PHQ - 2 Score '6 3 2  ' Altered sleeping '2 1 3  ' Tired, decreased energy '2 1 2  ' Change in appetite '1 1 2  ' Feeling bad or failure about yourself  '1 1 1  ' Trouble concentrating '1 1 1  ' Moving slowly or fidgety/restless '1 1 1  ' Suicidal thoughts 0 1 0  PHQ-9 Score '14 10 12  ' Difficult doing work/chores  Somewhat difficult Somewhat difficult Somewhat difficult    Hep C Screening: ***  STD testing and prevention (HIV/chl/gon/syphilis): ***  Intimate partner violence:***  Advanced Care Planning:  A voluntary discussion about advance care planning including the explanation and discussion of advance directives.  Discussed health care proxy and Living will, and the patient was able to identify a health care proxy as ***.  Patient {DOES_DOES KNL:97673} have a living will at present time. If patient does have living will, I have requested they bring this to the clinic to be scanned in to their chart.  Health Maintenance  Topic Date Due   COVID-19 Vaccine (4 - Booster for Janssen series) 03/12/2020   INFLUENZA VACCINE  12/15/2021   TETANUS/TDAP   01/05/2022   HPV VACCINES  Completed   Hepatitis C Screening  Completed   HIV Screening  Completed    Skin cancer: ***  last skin survey was.  Pt reports *** hx of skin cancer, suspicious lesions/biopsies in the past.  Colorectal cancer:  colonoscopy is ***   Pt denies ***  Prostate cancer: *** Prostate cancer screening with PSA: Discussed risks and benefits of PSA testing and provided handout. Pt *** to have PSA drawn today. No results found for: "PSA"  Urinary Symptoms:    Lung cancer:  *** Low Dose CT Chest recommended if Age 30-80 years, 20 pack-year currently smoking OR have quit w/in 15years. Patient {DOES NOT does:27190::"does not"} qualify.   Social History   Tobacco Use   Smoking status: Never   Smokeless tobacco: Never  Substance Use Topics   Alcohol use: No     Alcohol screening: [facility] Visit from 12/25/2021 in [facility]  AUDIT-C Score 0       AAA: *** The USPSTF recommends one-time screening with ultrasonography in men ages 85 to 56 years who have ever smoked  ECG:***  Blood pressure/Hypertension: BP Readings from Last 3 Encounters:  12/25/21 110/64  01/09/21 110/74  10/09/20 120/76   Weight/Obesity: Wt Readings from Last 3 Encounters:  12/25/21 154 lb 12.8 oz (70.2 kg)  01/09/21 159 lb 12.8 oz (72.5 kg)  10/09/20 159 lb 11.2 oz (72.4 kg) (56 %, Z= 0.16)*   * Growth percentiles are based on CDC (Boys, 2-20 Years) data.   BMI Readings from Last 3 Encounters:  12/25/21 19.61 kg/m  01/09/21 20.52 kg/m  10/09/20 20.50 kg/m (17 %, Z= -0.95)*   * Growth percentiles are based on CDC (Boys, 2-20 Years) data.    Lipids:  Lab Results  Component Value Date   CHOL 168 10/09/2020   CHOL 194 (H) 05/17/2019   Lab Results  Component Value Date   HDL 48 10/09/2020   HDL 61 05/17/2019   Lab Results  Component Value Date   LDLCALC 102 10/09/2020   LDLCALC 118 (H) 05/17/2019   Lab Results  Component Value Date    TRIG 85 10/09/2020   TRIG 56 05/17/2019   Lab Results  Component Value Date   CHOLHDL 3.5 10/09/2020   CHOLHDL 3.2 05/17/2019   No results found for: "LDLDIRECT" Based on the results of lipid panel his/her cardiovascular risk factor ( using Powers )  in the next 10 years is : The ASCVD Risk score (Arnett DK, et al., 2019) failed to calculate for the following reasons:   The 2019 ASCVD risk score is only valid for ages 34 to 47 Glucose:  Glucose, Bld  Date Value Ref Range Status  10/09/2020 107 (  H) 65 - 99 mg/dL Final    Comment:    .            Fasting reference interval . For someone without known diabetes, a glucose value between 100 and 125 mg/dL is consistent with prediabetes and should be confirmed with a follow-up test. .   05/17/2019 97 65 - 99 mg/dL Final    Comment:    .            Fasting reference interval .     Social History       Social History   Socioeconomic History   Marital status: Single    Spouse name: Not on file   Number of children: 0   Years of education: Not on file   Highest education level: High school graduate  Occupational History   Occupation: full-time student  Tobacco Use   Smoking status: Never   Smokeless tobacco: Never  Vaping Use   Vaping Use: Never used  Substance and Sexual Activity   Alcohol use: No   Drug use: Yes    Types: Marijuana   Sexual activity: Yes    Partners: Female    Birth control/protection: Condom  Other Topics Concern   Not on file  Social History Narrative   Started [employer] Fall 2020    Social Determinants of Health   Financial Resource Strain: Low Risk  (12/25/2021)   Overall Financial Resource Strain (CARDIA)    Difficulty of Paying Living Expenses: Not hard at all  Food Insecurity: No Food Insecurity (12/25/2021)   Hunger Vital Sign    Worried About Running Out of Food in the Last Year: Never true    Ran Out of Food in the Last Year: Never true  Transportation Needs:  No Transportation Needs (12/25/2021)   PRAPARE - Hydrologist (Medical): No    Lack of Transportation (Non-Medical): No  Physical Activity: Insufficiently Active (12/25/2021)   Exercise Vital Sign    Days of Exercise per Week: 1 day    Minutes of Exercise per Session: 30 min  Stress: Stress Concern Present (12/25/2021)   [place]    Feeling of Stress : Rather much  Social Connections: Socially Isolated (12/25/2021)   Social Connection and Isolation Panel [NHANES]    Frequency of Communication with Friends and Family: More than three times a week    Frequency of Social Gatherings with Friends and Family: More than three times a week    Attends Religious Services: Never    Marine scientist or Organizations: No    Attends Music therapist: Never    Marital Status: Never married    Family History        Family History  Problem Relation Age of Onset   Anxiety disorder Mother    Depression Mother    Hypertension Father     Patient Active Problem List   Diagnosis Date Noted   Insomnia 10/09/2020   Current moderate episode of major depressive disorder without prior episode ([place]) 12/12/2018    Past Surgical History:  Procedure Laterality Date   TONSILLECTOMY       Current Outpatient Medications:    sertraline (ZOLOFT) 100 MG tablet, Take 1 tablet (100 mg total) by mouth daily., Disp: 90 tablet, Rfl: 3  No Known Allergies  Patient Care Team: Delsa Grana, PA-C as PCP - General (Family Medicine) [place], [place], [place] as  Social Worker   Chart Review: ***  Review of Systems        Objective:   Vitals:  Vitals:   12/25/21 1432  BP: 110/64  Pulse: 78  Resp: 16  Temp: 98.5 F (36.9 C)  TempSrc: Oral  SpO2: 97%  Weight: 154 lb 12.8 oz (70.2 kg)  Height: 6' 2.5" (1.892 m)    Body mass index is 19.61 kg/m.  Physical Exam   No results found for  this or any previous visit (from the past 2160 hour(s)).  Fall Risk:    12/25/2021    2:28 PM 01/09/2021    1:38 PM 10/09/2020   10:40 AM 10/03/2019    2:55 PM 08/09/2019    8:27 AM  [place] in the past year? 0 0 0 0 0  Number falls in past yr: 0 0 0 0 0  Injury with Fall? 0 0 0 0 0  Risk for fall due to : No Fall Risks      Follow up Falls prevention discussed;Education provided        Functional Status Survey: Is the patient deaf or have difficulty hearing?: No Does the patient have difficulty seeing, even when wearing glasses/contacts?: No Does the patient have difficulty concentrating, remembering, or making decisions?: No Does the patient have difficulty walking or climbing stairs?: No Does the patient have difficulty dressing or bathing?: No Does the patient have difficulty doing errands alone such as visiting a doctor's office or shopping?: No   Assessment & Plan:    CPE completed today  Prostate cancer screening and PSA options (with potential risks and benefits of testing vs not testing) were discussed along with recent recs/guidelines, shared decision making and handout/information given to pt today  USPSTF grade A and B recommendations reviewed with patient; age-appropriate recommendations, preventive care, screening tests, etc discussed and encouraged; healthy living encouraged; see AVS for patient education given to patient  Discussed importance of 150 minutes of physical activity weekly, AHA exercise recommendations given to pt in AVS/handout  Discussed importance of healthy diet:  eating lean meats and proteins, avoiding trans fats and saturated fats, avoid simple sugars and excessive carbs in diet, eat 6 servings of fruit/vegetables daily and drink plenty of water and avoid sweet beverages.  DASH diet reviewed if pt has HTN  Recommended pt to do annual eye exam and routine dental exams/cleanings  Advance Care planning information and packet discussed and  offered today, encouraged pt to discuss with family members/spouse/partner/friends and complete Advanced directive packet and bring copy to office   Reviewed Health Maintenance: Health Maintenance  Topic Date Due   COVID-19 Vaccine (4 - Booster for Janssen series) 03/12/2020   INFLUENZA VACCINE  12/15/2021   TETANUS/TDAP  01/05/2022   HPV VACCINES  Completed   Hepatitis C Screening  Completed   HIV Screening  Completed    Immunizations: Immunization History  Administered Date(s) Administered   DTaP 12/27/2000, 03/01/2001, 04/25/2001, 02/02/2002, 12/20/2005   HPV Quadrivalent 04/11/2017, 06/14/2017, 10/13/2017   Hepatitis A, Ped/Adol-2 Dose 03/22/2018, 09/08/2018   Hepatitis B, ped/adol 05-13-2001, 11/24/2000, 08/11/2001   HiB (PRP-OMP) 12/27/2000, 03/01/2001, 04/25/2001, 02/03/2012   IPV 12/27/2000, 03/01/2001, 08/11/2001, 12/20/2005   Influenza,inj,Quad PF,6+ Mos 02/15/2019   Influenza-Unspecified 02/26/2020   Janssen (J&J) SARS-COV-2 Vaccination 08/16/2019   MMR 10/24/2001, 12/20/2005   Meningococcal Conjugate 10/13/2017   OPV 03/22/2018   PFIZER(Purple Top)SARS-COV-2 Vaccination 12/26/2019, 01/16/2020   Pneumococcal-Unspecified 12/27/2000, 03/01/2001, 08/11/2001, 10/24/2001   Td 01/06/2012  Tdap 01/06/2012   Varicella 10/24/2001, 03/22/2018   Vaccines:  HPV: up to at age 23 , ask insurance if age between 28-45  Shingrix: 63-64 yo and ask insurance if covered when patient above 65 yo Pneumonia: *** educated and discussed with patient. Flu: *** educated and discussed with patient. COVID:       ICD-10-CM   1. Annual physical exam  Z00.00     2. Current moderate episode of major depressive disorder without prior episode ([place])  F32.1     3. Insomnia, unspecified type  G47.00           Delsa Grana, PA-C 12/25/21 2:59 PM  [facility]

## 2022-01-01 ENCOUNTER — Encounter: Payer: Self-pay | Admitting: Family Medicine

## 2022-02-24 ENCOUNTER — Encounter: Payer: Self-pay | Admitting: Family Medicine

## 2022-02-24 ENCOUNTER — Telehealth (INDEPENDENT_AMBULATORY_CARE_PROVIDER_SITE_OTHER): Payer: BC Managed Care – PPO | Admitting: Family Medicine

## 2022-02-24 DIAGNOSIS — F321 Major depressive disorder, single episode, moderate: Secondary | ICD-10-CM

## 2022-02-24 MED ORDER — SERTRALINE HCL 100 MG PO TABS: 150.0000 mg | ORAL_TABLET | Freq: Every day | ORAL | 3 refills | Status: AC

## 2022-02-24 NOTE — Progress Notes (Signed)
Name: Gabriel Pierce   MRN: [id number]    DOB: January 12, 2001   Date:02/24/2022       Progress Note  Subjective:    Chief Complaint  Chief Complaint  Patient presents with   Follow-up   Depression    Pt states increased medication has not helped much. No vital signs can be provided    I connected with  [name] on 02/24/22 at  3:00 PM EDT by telephone and verified that I am speaking with the correct person using two identifiers.  I discussed the limitations, risks, security and privacy concerns of performing an evaluation and management service by telephone and the availability of in person appointments. Staff also discussed with the patient that there may be a patient responsible charge related to this service. Patient Location: dorm room/home Provider Location: [facility] Additional Individuals present: none  HPI   F/up on moods/depression since getting back on campus at college (locally), dose of meds changed He feels moods a little better  PHQ2/9:    02/24/2022   11:01 AM 12/25/2021    2:29 PM 01/09/2021    1:40 PM 10/09/2020   10:41 AM 10/03/2019    2:56 PM  Depression screen PHQ 2/9  Decreased Interest 1 3 2 2 2   Down, Depressed, Hopeless 1 3 1  0 2  PHQ - 2 Score 2 6 3 2 4   Altered sleeping 1 2 1 3 3   Tired, decreased energy 1 2 1 2 2   Change in appetite 1 1 1 2 3   Feeling bad or failure about yourself  1 1 1 1 1   Trouble concentrating 1 1 1 1 1   Moving slowly or fidgety/restless 0 1 1 1  0  Suicidal thoughts 0 0 1 0 0  PHQ-9 Score 7 14 10 12 14   Difficult doing work/chores Somewhat difficult Somewhat difficult Somewhat difficult Somewhat difficult Somewhat difficult   Busier, sleeping well, feeling    Patient Active Problem List   Diagnosis Date Noted   Mood disorder ([place]) 12/25/2021   Insomnia 10/09/2020   Current moderate episode of major depressive disorder without prior episode ([place]) 12/12/2018    Current Outpatient Medications:    sertraline  (ZOLOFT) 100 MG tablet, Take 1.5 tablets (150 mg total) by mouth daily., Disp: 135 tablet, Rfl: 1 No Known Allergies  Past Surgical History:  Procedure Laterality Date   TONSILLECTOMY     Family History  Problem Relation Age of Onset   Anxiety disorder Mother    Depression Mother    Hypertension Father    Social History   Socioeconomic History   Marital status: Single    Spouse name: Not on file   Number of children: 0   Years of education: Not on file   Highest education level: High school graduate  Occupational History   Occupation: full-time student  Tobacco Use   Smoking status: Never   Smokeless tobacco: Never  Vaping Use   Vaping Use: Never used  Substance and Sexual Activity   Alcohol use: No   Drug use: Yes    Types: Marijuana   Sexual activity: Yes    Partners: Female    Birth control/protection: Condom  Other Topics Concern   Not on file  Social History Narrative   Started [employer] Fall 2020    Social Determinants of Health   Financial Resource Strain: Low Risk  (12/25/2021)   Overall Financial Resource Strain (CARDIA)    Difficulty of Paying Living Expenses: Not  hard at all  Food Insecurity: No Food Insecurity (12/25/2021)   Hunger Vital Sign    Worried About Running Out of Food in the Last Year: Never true    Ran Out of Food in the Last Year: Never true  Transportation Needs: No Transportation Needs (12/25/2021)   PRAPARE - Administrator, Civil Service (Medical): No    Lack of Transportation (Non-Medical): No  Physical Activity: Insufficiently Active (12/25/2021)   Exercise Vital Sign    Days of Exercise per Week: 1 day    Minutes of Exercise per Session: 30 min  Stress: Stress Concern Present (12/25/2021)   [employer] of Occupational Health - Occupational Stress Questionnaire    Feeling of Stress : Rather much  Social Connections: Socially Isolated (12/25/2021)   Social Connection and Isolation Panel [NHANES]     Frequency of Communication with Friends and Family: More than three times a week    Frequency of Social Gatherings with Friends and Family: More than three times a week    Attends Religious Services: Never    Database administrator or Organizations: No    Attends Banker Meetings: Never    Marital Status: Never married  Intimate Partner Violence: Not At Risk (12/25/2021)   Humiliation, Afraid, Rape, and Kick questionnaire    Fear of Current or Ex-Partner: No    Emotionally Abused: No    Physically Abused: No    Sexually Abused: No    Chart Review Today: I personally reviewed active problem list, medication list, allergies, family history, social history, health maintenance, notes from last encounter, lab results, imaging with the patient/caregiver today.  Review of Systems  Constitutional: Negative.   HENT: Negative.    Eyes: Negative.   Respiratory: Negative.    Cardiovascular: Negative.   Gastrointestinal: Negative.   Endocrine: Negative.   Genitourinary: Negative.   Musculoskeletal: Negative.   Skin: Negative.   Allergic/Immunologic: Negative.   Neurological: Negative.   Hematological: Negative.   Psychiatric/Behavioral: Negative.  Negative for agitation, behavioral problems, self-injury, sleep disturbance and suicidal ideas.   All other systems reviewed and are negative.    Objective:    Virtual encounter, vitals limited, only able to obtain the following: There were no vitals filed for this visit. There is no height or weight on file to calculate BMI. Nursing Note and Vital Signs reviewed.  Physical Exam Vitals and nursing note reviewed.  Neurological:     Mental Status: He is alert.  Psychiatric:        Attention and Perception: Attention normal.        Mood and Affect: Mood normal.        Speech: Speech normal.        Behavior: Behavior is cooperative.        Thought Content: Thought content normal.        Cognition and Memory: Cognition  normal.     PE limited by telephone encounter    PHQ-2/9 Result is reviewed and mildly positive - see HPI and A&P  Fall Risk:    02/24/2022   11:00 AM 12/25/2021    2:28 PM 01/09/2021    1:38 PM 10/09/2020   10:40 AM 10/03/2019    2:55 PM  Fall Risk   Falls in the past year? 0 0 0 0 0  Number falls in past yr: 0 0 0 0 0  Injury with Fall? 0 0 0 0 0  Risk for fall due to : No  Fall Risks No Fall Risks     Follow up Falls prevention discussed;Education provided Falls prevention discussed;Education provided        Assessment and Plan:     ICD-10-CM   1. Current moderate episode of major depressive disorder, unspecified whether recurrent (HCC)  F32.1 sertraline (ZOLOFT) 100 MG tablet   PHQ score decreased, he endorses less sx, and he feels good with zoloft 150 still, sleeping well, no SI, continue same med dose, f/up for CPE in 6-12 M     Reviewed pts outside medical records again with him, noted mood disorder/bipolar and some gene testing re meds but no objective info available (lab results) or dx code noted At that time he was doing well on zoloft and so other meds were not pursued, today similar situation, his sx are better, he has excellent coping skills (self care, positive self talk) he has done better with more structure and has less depressive sx.  He would like to stay with same med for now. I still encouraged pt to get therapist to connect and have one available - psychologytoday.com, better help website, talkspace apps or local psychiatry or counseling services.    I discussed the assessment and treatment plan with the patient. The patient was provided an opportunity to ask questions and all were answered. The patient agreed with the plan and demonstrated an understanding of the instructions.   The patient was advised to call back or seek an in-person evaluation if the symptoms worsen or if the condition fails to improve as anticipated.  I provided 20 minutes of  non-face-to-face time during this encounter.  Danelle Berry, PA-C 02/24/22 3:20 PM
